# Patient Record
Sex: Female | Born: 1935
Health system: Southern US, Community
[De-identification: ages and names within clinical notes are randomized; demographics above are authoritative.]

## PROBLEM LIST (undated history)

## (undated) DIAGNOSIS — I10 Essential (primary) hypertension: Secondary | ICD-10-CM

## (undated) HISTORY — PX: TOTAL KNEE ARTHROPLASTY: SHX125

---

## 2013-12-30 ENCOUNTER — Emergency Department (HOSPITAL_BASED_OUTPATIENT_CLINIC_OR_DEPARTMENT_OTHER)
Admission: EM | Admit: 2013-12-30 | Discharge: 2013-12-30 | Disposition: A | Discharge status: Home / Self Care | Payer: Medicare Other | Attending: Emergency Medicine | Admitting: Emergency Medicine

## 2013-12-30 ENCOUNTER — Emergency Department (HOSPITAL_BASED_OUTPATIENT_CLINIC_OR_DEPARTMENT_OTHER): Payer: Medicare Other

## 2013-12-30 ENCOUNTER — Encounter (HOSPITAL_BASED_OUTPATIENT_CLINIC_OR_DEPARTMENT_OTHER): Payer: Self-pay | Admitting: Emergency Medicine

## 2013-12-30 DIAGNOSIS — Y9301 Activity, walking, marching and hiking: Secondary | ICD-10-CM | POA: Diagnosis not present

## 2013-12-30 DIAGNOSIS — S0101XA Laceration without foreign body of scalp, initial encounter: Secondary | ICD-10-CM | POA: Diagnosis not present

## 2013-12-30 DIAGNOSIS — S0990XA Unspecified injury of head, initial encounter: Secondary | ICD-10-CM | POA: Insufficient documentation

## 2013-12-30 DIAGNOSIS — I10 Essential (primary) hypertension: Secondary | ICD-10-CM | POA: Diagnosis not present

## 2013-12-30 DIAGNOSIS — W19XXXA Unspecified fall, initial encounter: Secondary | ICD-10-CM

## 2013-12-30 DIAGNOSIS — Y998 Other external cause status: Secondary | ICD-10-CM | POA: Diagnosis not present

## 2013-12-30 DIAGNOSIS — W01198A Fall on same level from slipping, tripping and stumbling with subsequent striking against other object, initial encounter: Secondary | ICD-10-CM | POA: Insufficient documentation

## 2013-12-30 DIAGNOSIS — Y9289 Other specified places as the place of occurrence of the external cause: Secondary | ICD-10-CM | POA: Insufficient documentation

## 2013-12-30 HISTORY — DX: Essential (primary) hypertension: I10

## 2013-12-30 MED ORDER — LIDOCAINE-EPINEPHRINE 2 %-1:100000 IJ SOLN
INTRAMUSCULAR | Status: AC
  Administered 2013-12-30: 1.7 mL via INTRADERMAL
  Filled 2013-12-30: qty 1

## 2013-12-30 MED ORDER — TETANUS-DIPHTH-ACELL PERTUSSIS 5-2.5-18.5 LF-MCG/0.5 IM SUSP: 0.5000 mL | Freq: Once | INTRAMUSCULAR | Status: DC

## 2013-12-30 MED ORDER — LIDOCAINE-EPINEPHRINE 2 %-1:100000 IJ SOLN
1.7000 mL | Freq: Once | INTRAMUSCULAR | Status: AC
  Administered 2013-12-30: 1.7 mL via INTRADERMAL

## 2013-12-30 NOTE — Discharge Instructions (Signed)
Staples to be removed in 5-7 days. Return sooner if you develop increased pain, redness, pus draining from the wound, or other new and concerning symptoms.   Concussion A concussion, or closed-head injury, is a brain injury caused by a direct blow to the head or by a quick and sudden movement (jolt) of the head or neck. Concussions are usually not life-threatening. Even so, the effects of a concussion can be serious. If you have had a concussion before, you are more likely to experience concussion-like symptoms after a direct blow to the head.  CAUSES  Direct blow to the head, such as from running into another player during a soccer game, being hit in a fight, or hitting your head on a hard surface.  A jolt of the head or neck that causes the brain to move back and forth inside the skull, such as in a car crash. SIGNS AND SYMPTOMS The signs of a concussion can be hard to notice. Early on, they may be missed by you, family members, and health care providers. You may look fine but act or feel differently. Symptoms are usually temporary, but they may last for days, weeks, or even longer. Some symptoms may appear right away while others may not show up for hours or days. Every head injury is different. Symptoms include:  Mild to moderate headaches that will not go away.  A feeling of pressure inside your head.  Having more trouble than usual:  Learning or remembering things you have heard.  Answering questions.  Paying attention or concentrating.  Organizing daily tasks.  Making decisions and solving problems.  Slowness in thinking, acting or reacting, speaking, or reading.  Getting lost or being easily confused.  Feeling tired all the time or lacking energy (fatigued).  Feeling drowsy.  Sleep disturbances.  Sleeping more than usual.  Sleeping less than usual.  Trouble falling asleep.  Trouble sleeping (insomnia).  Loss of balance or feeling lightheaded or dizzy.  Nausea  or vomiting.  Numbness or tingling.  Increased sensitivity to:  Sounds.  Lights.  Distractions.  Vision problems or eyes that tire easily.  Diminished sense of taste or smell.  Ringing in the ears.  Mood changes such as feeling sad or anxious.  Becoming easily irritated or angry for little or no reason.  Lack of motivation.  Seeing or hearing things other people do not see or hear (hallucinations). DIAGNOSIS Your health care provider can usually diagnose a concussion based on a description of your injury and symptoms. He or she will ask whether you passed out (lost consciousness) and whether you are having trouble remembering events that happened right before and during your injury. Your evaluation might include:  A brain scan to look for signs of injury to the brain. Even if the test shows no injury, you may still have a concussion.  Blood tests to be sure other problems are not present. TREATMENT  Concussions are usually treated in an emergency department, in urgent care, or at a clinic. You may need to stay in the hospital overnight for further treatment.  Tell your health care provider if you are taking any medicines, including prescription medicines, over-the-counter medicines, and natural remedies. Some medicines, such as blood thinners (anticoagulants) and aspirin, may increase the chance of complications. Also tell your health care provider whether you have had alcohol or are taking illegal drugs. This information may affect treatment.  Your health care provider will send you home with important instructions to follow.  How fast you will recover from a concussion depends on many factors. These factors include how severe your concussion is, what part of your brain was injured, your age, and how healthy you were before the concussion.  Most people with mild injuries recover fully. Recovery can take time. In general, recovery is slower in older persons. Also, persons  who have had a concussion in the past or have other medical problems may find that it takes longer to recover from their current injury. HOME CARE INSTRUCTIONS General Instructions  Carefully follow the directions your health care provider gave you.  Only take over-the-counter or prescription medicines for pain, discomfort, or fever as directed by your health care provider.  Take only those medicines that your health care provider has approved.  Do not drink alcohol until your health care provider says you are well enough to do so. Alcohol and certain other drugs may slow your recovery and can put you at risk of further injury.  If it is harder than usual to remember things, write them down.  If you are easily distracted, try to do one thing at a time. For example, do not try to watch TV while fixing dinner.  Talk with family members or close friends when making important decisions.  Keep all follow-up appointments. Repeated evaluation of your symptoms is recommended for your recovery.  Watch your symptoms and tell others to do the same. Complications sometimes occur after a concussion. Older adults with a brain injury may have a higher risk of serious complications, such as a blood clot on the brain.  Tell your teachers, school nurse, school counselor, coach, athletic trainer, or work Production designer, theatre/television/filmmanager about your injury, symptoms, and restrictions. Tell them about what you can or cannot do. They should watch for:  Increased problems with attention or concentration.  Increased difficulty remembering or learning new information.  Increased time needed to complete tasks or assignments.  Increased irritability or decreased ability to cope with stress.  Increased symptoms.  Rest. Rest helps the brain to heal. Make sure you:  Get plenty of sleep at night. Avoid staying up late at night.  Keep the same bedtime hours on weekends and weekdays.  Rest during the day. Take daytime naps or rest  breaks when you feel tired.  Limit activities that require a lot of thought or concentration. These include:  Doing homework or job-related work.  Watching TV.  Working on the computer.  Avoid any situation where there is potential for another head injury (football, hockey, soccer, basketball, martial arts, downhill snow sports and horseback riding). Your condition will get worse every time you experience a concussion. You should avoid these activities until you are evaluated by the appropriate follow-up health care providers. Returning To Your Regular Activities You will need to return to your normal activities slowly, not all at once. You must give your body and brain enough time for recovery.  Do not return to sports or other athletic activities until your health care provider tells you it is safe to do so.  Ask your health care provider when you can drive, ride a bicycle, or operate heavy machinery. Your ability to react may be slower after a brain injury. Never do these activities if you are dizzy.  Ask your health care provider about when you can return to work or school. Preventing Another Concussion It is very important to avoid another brain injury, especially before you have recovered. In rare cases, another injury can lead to permanent  brain damage, brain swelling, or death. The risk of this is greatest during the first 7-10 days after a head injury. Avoid injuries by:  Wearing a seat belt when riding in a car.  Drinking alcohol only in moderation.  Wearing a helmet when biking, skiing, skateboarding, skating, or doing similar activities.  Avoiding activities that could lead to a second concussion, such as contact or recreational sports, until your health care provider says it is okay.  Taking safety measures in your home.  Remove clutter and tripping hazards from floors and stairways.  Use grab bars in bathrooms and handrails by stairs.  Place non-slip mats on floors  and in bathtubs.  Improve lighting in dim areas. SEEK MEDICAL CARE IF:  You have increased problems paying attention or concentrating.  You have increased difficulty remembering or learning new information.  You need more time to complete tasks or assignments than before.  You have increased irritability or decreased ability to cope with stress.  You have more symptoms than before. Seek medical care if you have any of the following symptoms for more than 2 weeks after your injury:  Lasting (chronic) headaches.  Dizziness or balance problems.  Nausea.  Vision problems.  Increased sensitivity to noise or light.  Depression or mood swings.  Anxiety or irritability.  Memory problems.  Difficulty concentrating or paying attention.  Sleep problems.  Feeling tired all the time. SEEK IMMEDIATE MEDICAL CARE IF:  You have severe or worsening headaches. These may be a sign of a blood clot in the brain.  You have weakness (even if only in one hand, leg, or part of the face).  You have numbness.  You have decreased coordination.  You vomit repeatedly.  You have increased sleepiness.  One pupil is larger than the other.  You have convulsions.  You have slurred speech.  You have increased confusion. This may be a sign of a blood clot in the brain.  You have increased restlessness, agitation, or irritability.  You are unable to recognize people or places.  You have neck pain.  It is difficult to wake you up.  You have unusual behavior changes.  You lose consciousness. MAKE SURE YOU:  Understand these instructions.  Will watch your condition.  Will get help right away if you are not doing well or get worse. Document Released: 03/14/2003 Document Revised: 12/27/2012 Document Reviewed: 07/14/2012 [facility] Patient Information 2015 [place], [place]. This information is not intended to replace advice given to you by your health care provider. Make sure you  discuss any questions you have with your health care provider.

## 2013-12-30 NOTE — ED Notes (Signed)
Pt presents to ED with complaints of head lac after falling today. No bleeding in triage

## 2013-12-30 NOTE — ED Provider Notes (Signed)
CSN: [id number]     Arrival date & time 12/30/13  1831 History  This chart was scribed for Geoffery Lyonsouglas Gabriel Conry, MD by Modena JanskyAlbert Thayil, ED Scribe. This patient was seen in room MH09/MH09 and the patient's care was started at 7:54 PM.    Chief Complaint  Patient presents with  . Fall  . Head Laceration   Patient is a 80 y.o. female presenting with fall and scalp laceration. The history is provided by the patient. No language interpreter was used.  Fall This is a new problem. The current episode started 3 to 5 hours ago. The problem occurs rarely. The problem has not changed since onset.Nothing aggravates the symptoms. She has tried nothing for the symptoms.  Head Laceration   HPI Comments: Sara Estrada is a 80 y.o. female who presents to the Emergency Department complaining of a fall that occurred about 3 hours ago. She reports that fell while ambulating and hit her head on the wall. She reports no LOC. She states that she has a head laceration that has been bleeding quite a bit. She reports that she is unsure of her tetanus status. She denies any use of blood thinners (aside from aspirin), numbness or tingling, or neck pain. NKA  Past Medical History  Diagnosis Date  . Hypertension    Past Surgical History  Procedure Laterality Date  . Total knee arthroplasty     No family history on file. History  Substance Use Topics  . Smoking status: Never Smoker   . Smokeless tobacco: Not on file  . Alcohol Use: Not on file   OB History    No data available     Review of Systems  Musculoskeletal: Negative for neck pain.  Skin: Positive for wound.  Neurological: Negative for numbness.  Hematological: Does not bruise/bleed easily.  All other systems reviewed and are negative.     Allergies  Review of patient's allergies indicates no known allergies.  Home Medications   Prior to Admission medications   Not on File   BP 141/74 mmHg  Pulse 80  Temp(Src) 98.3 F (36.8 C) (Oral)  Resp 18   Ht 5' (1.524 m)  Wt 134 lb (60.782 kg)  BMI 26.17 kg/m2  SpO2 95% Physical Exam  Constitutional: She is oriented to person, place, and time. She appears well-developed and well-nourished. No distress.  HENT:  Head: Normocephalic and atraumatic.  2.5 cm superficial laceration to the right parietal region. Bleeding is controlled and laceration is well approximated. NO palpable defects.   Eyes: EOM are normal. Pupils are equal, round, and reactive to light.  Neck: Neck supple. No tracheal deviation present.  NO C spine TTP. Painless RON in all directions.   Cardiovascular: Normal rate.   Pulmonary/Chest: Effort normal. No respiratory distress.  Musculoskeletal: Normal range of motion.  Neurological: She is alert and oriented to person, place, and time. No cranial nerve deficit. She exhibits normal muscle tone. Coordination normal.  Skin: Skin is warm and dry.  Psychiatric: She has a normal mood and affect. Her behavior is normal.  Nursing note and vitals reviewed.   ED Course  Procedures (including critical care time) DIAGNOSTIC STUDIES: Oxygen Saturation is 95% on RA, normal by my interpretation.    COORDINATION OF CARE: 7:58 PM- Pt advised of plan for treatment and pt agrees.  Labs Review Labs Reviewed - No data to display  Imaging Review No results found.   EKG Interpretation None     LACERATION REPAIR Performed by: Judd LieneLo,  Riley Lamouglas Authorized by: Geoffery LyonseLo, Jafari Mckillop Consent: Verbal consent obtained. Risks and benefits: risks, benefits and alternatives were discussed Consent given by: patient Patient identity confirmed: provided demographic data Prepped and Draped in normal sterile fashion Wound explored  Laceration Location: Right parietal scalp  Laceration Length: 2.5 cm  No Foreign Bodies seen or palpated  Anesthesia: local infiltration  Local anesthetic: lidocaine 1 % with epinephrine  Anesthetic total: 2 ml  Irrigation method: syringe Amount of cleaning:  standard  Skin closure: Staples   Number of sutures: 3 staples   Technique: Staples   Patient tolerance: Patient tolerated the procedure well with no immediate complications.   MDM   Final diagnoses:  None    Patient presents after a fall while walking down the hall. She struck the right top of her head on the wall and caused a small laceration which was repaired. He was no loss of consciousness, she is neurologically intact, and CT scan of the head reveals no acute intracranial abnormality. At this point I feel as though she is appropriate for discharge with suture removal in 5 days.  I personally performed the services described in this documentation, which was scribed in my presence. The recorded information has been reviewed and is accurate.      Geoffery Lyonsouglas Johniece Hornbaker, MD 12/31/13 2014

## 2013-12-30 NOTE — ED Notes (Signed)
Pt d/c'd per order prior to new order of Tdap.

## 2013-12-30 NOTE — ED Notes (Signed)
[facility]. ~1", scant ooze present.

## 2013-12-30 NOTE — ED Notes (Signed)
Pt alert, NAD, calm, interactive, speech clear, no dyspnea noted, (denies: pain, sob, nausea, visual changes or dizziness), [facility]

## 2013-12-30 NOTE — ED Notes (Signed)
Wound closed with 3 staples, approximated well. No oozing or bleeding noted. Continues to deny pain. Bacitracin applied.

## 2014-01-04 ENCOUNTER — Encounter (HOSPITAL_BASED_OUTPATIENT_CLINIC_OR_DEPARTMENT_OTHER): Payer: Self-pay | Admitting: *Deleted

## 2014-01-04 ENCOUNTER — Emergency Department (HOSPITAL_BASED_OUTPATIENT_CLINIC_OR_DEPARTMENT_OTHER)
Admission: EM | Admit: 2014-01-04 | Discharge: 2014-01-04 | Disposition: A | Discharge status: Home / Self Care | Payer: Medicare Other | Attending: Emergency Medicine | Admitting: Emergency Medicine

## 2014-01-04 DIAGNOSIS — Z4802 Encounter for removal of sutures: Secondary | ICD-10-CM | POA: Insufficient documentation

## 2014-01-04 DIAGNOSIS — I10 Essential (primary) hypertension: Secondary | ICD-10-CM | POA: Diagnosis not present

## 2014-01-04 NOTE — ED Notes (Addendum)
Here for staple removal. Staples x 3 noted to her scalp.

## 2014-01-04 NOTE — Discharge Instructions (Signed)

## 2014-01-04 NOTE — ED Provider Notes (Signed)
CSN: [id number]     Arrival date & time 01/04/14  1149 History   First MD Initiated Contact with Patient 01/04/14 1207     Chief Complaint  Patient presents with  . Suture / Staple Removal     (Consider location/radiation/quality/duration/timing/severity/associated sxs/prior Treatment) Patient is a 80 y.o. female presenting with suture removal. The history is provided by the patient. No language interpreter was used.  Suture / Staple Removal This is a new problem. The current episode started today. The problem occurs constantly. The problem has been unchanged. Pertinent negatives include no headaches. Nothing aggravates the symptoms. She has tried nothing for the symptoms. The treatment provided no relief.  Pt here for staple removal  Past Medical History  Diagnosis Date  . Hypertension    Past Surgical History  Procedure Laterality Date  . Total knee arthroplasty     No family history on file. History  Substance Use Topics  . Smoking status: Never Smoker   . Smokeless tobacco: Not on file  . Alcohol Use: Not on file   OB History    No data available     Review of Systems  Neurological: Negative for headaches.  All other systems reviewed and are negative.     Allergies  Review of patient's allergies indicates no known allergies.  Home Medications   Prior to Admission medications   Not on File   BP 133/76 mmHg  Pulse 67  Temp(Src) 98.3 F (36.8 C) (Oral)  Resp 16  Ht 5' (1.524 m)  Wt 134 lb (60.782 kg)  BMI 26.17 kg/m2  SpO2 93% Physical Exam  Constitutional: She is oriented to person, place, and time. She appears well-developed and well-nourished.  HENT:  Head: Normocephalic and atraumatic.  Eyes: Conjunctivae and EOM are normal. Pupils are equal, round, and reactive to light.  Neck: Normal range of motion. Neck supple.  Cardiovascular: Normal rate and normal heart sounds.   Pulmonary/Chest: Effort normal and breath sounds normal.  Musculoskeletal:  Normal range of motion.  Neurological: She is alert and oriented to person, place, and time. She has normal reflexes.  Skin: Skin is warm.  Psychiatric: She has a normal mood and affect.  Nursing note and vitals reviewed.   ED Course  Procedures (including critical care time) Labs Review Labs Reviewed - No data to display  Imaging Review No results found.   EKG Interpretation None      MDM   Final diagnoses:  Removal of staples    Staples removed.    Lonia SkinnerLeslie K KingsburySofia, PA-C 01/04/14 1256  Ethelda ChickMartha K Linker, MD 01/04/14 [phone]

## 2015-02-14 DIAGNOSIS — J069 Acute upper respiratory infection, unspecified: Secondary | ICD-10-CM | POA: Diagnosis not present

## 2015-03-19 DIAGNOSIS — R7301 Impaired fasting glucose: Secondary | ICD-10-CM | POA: Diagnosis not present

## 2015-03-19 DIAGNOSIS — Z0000 Encounter for general adult medical examination without abnormal findings: Secondary | ICD-10-CM | POA: Diagnosis not present

## 2015-03-19 DIAGNOSIS — I10 Essential (primary) hypertension: Secondary | ICD-10-CM | POA: Diagnosis not present

## 2015-03-19 DIAGNOSIS — R630 Anorexia: Secondary | ICD-10-CM | POA: Diagnosis not present

## 2015-03-19 DIAGNOSIS — E782 Mixed hyperlipidemia: Secondary | ICD-10-CM | POA: Diagnosis not present

## 2015-03-19 DIAGNOSIS — Z1231 Encounter for screening mammogram for malignant neoplasm of breast: Secondary | ICD-10-CM | POA: Diagnosis not present

## 2015-03-19 DIAGNOSIS — K219 Gastro-esophageal reflux disease without esophagitis: Secondary | ICD-10-CM | POA: Diagnosis not present

## 2015-03-19 DIAGNOSIS — E559 Vitamin D deficiency, unspecified: Secondary | ICD-10-CM | POA: Diagnosis not present

## 2015-03-19 DIAGNOSIS — R600 Localized edema: Secondary | ICD-10-CM | POA: Diagnosis not present

## 2015-03-19 DIAGNOSIS — Z13820 Encounter for screening for osteoporosis: Secondary | ICD-10-CM | POA: Diagnosis not present

## 2015-03-19 DIAGNOSIS — F320 Major depressive disorder, single episode, mild: Secondary | ICD-10-CM | POA: Diagnosis not present

## 2015-03-19 DIAGNOSIS — Z79899 Other long term (current) drug therapy: Secondary | ICD-10-CM | POA: Diagnosis not present

## 2015-03-20 DIAGNOSIS — H3530 Unspecified macular degeneration: Secondary | ICD-10-CM | POA: Diagnosis not present

## 2015-03-20 DIAGNOSIS — H1859 Other hereditary corneal dystrophies: Secondary | ICD-10-CM | POA: Diagnosis not present

## 2015-03-20 DIAGNOSIS — H04123 Dry eye syndrome of bilateral lacrimal glands: Secondary | ICD-10-CM | POA: Diagnosis not present

## 2015-04-16 DIAGNOSIS — E782 Mixed hyperlipidemia: Secondary | ICD-10-CM | POA: Diagnosis not present

## 2015-04-16 DIAGNOSIS — I10 Essential (primary) hypertension: Secondary | ICD-10-CM | POA: Diagnosis not present

## 2015-04-16 DIAGNOSIS — F320 Major depressive disorder, single episode, mild: Secondary | ICD-10-CM | POA: Diagnosis not present

## 2015-04-23 DIAGNOSIS — Z1231 Encounter for screening mammogram for malignant neoplasm of breast: Secondary | ICD-10-CM | POA: Diagnosis not present

## 2015-05-13 DIAGNOSIS — M1712 Unilateral primary osteoarthritis, left knee: Secondary | ICD-10-CM | POA: Diagnosis not present

## 2015-05-13 DIAGNOSIS — M778 Other enthesopathies, not elsewhere classified: Secondary | ICD-10-CM | POA: Diagnosis not present

## 2015-05-13 DIAGNOSIS — M25532 Pain in left wrist: Secondary | ICD-10-CM | POA: Diagnosis not present

## 2015-09-05 DIAGNOSIS — M1712 Unilateral primary osteoarthritis, left knee: Secondary | ICD-10-CM | POA: Diagnosis not present

## 2015-09-07 DIAGNOSIS — I10 Essential (primary) hypertension: Secondary | ICD-10-CM | POA: Diagnosis not present

## 2015-09-07 DIAGNOSIS — E782 Mixed hyperlipidemia: Secondary | ICD-10-CM | POA: Diagnosis not present

## 2015-09-17 DIAGNOSIS — R7301 Impaired fasting glucose: Secondary | ICD-10-CM | POA: Diagnosis not present

## 2015-09-17 DIAGNOSIS — R413 Other amnesia: Secondary | ICD-10-CM | POA: Diagnosis not present

## 2015-09-17 DIAGNOSIS — E782 Mixed hyperlipidemia: Secondary | ICD-10-CM | POA: Diagnosis not present

## 2015-09-17 DIAGNOSIS — I10 Essential (primary) hypertension: Secondary | ICD-10-CM | POA: Diagnosis not present

## 2015-09-21 DIAGNOSIS — R7301 Impaired fasting glucose: Secondary | ICD-10-CM | POA: Diagnosis not present

## 2016-02-14 DIAGNOSIS — J209 Acute bronchitis, unspecified: Secondary | ICD-10-CM | POA: Diagnosis not present

## 2016-03-02 DIAGNOSIS — M25531 Pain in right wrist: Secondary | ICD-10-CM | POA: Diagnosis not present

## 2016-03-02 DIAGNOSIS — R0781 Pleurodynia: Secondary | ICD-10-CM | POA: Diagnosis not present

## 2016-03-02 DIAGNOSIS — S6991XA Unspecified injury of right wrist, hand and finger(s), initial encounter: Secondary | ICD-10-CM | POA: Diagnosis not present

## 2016-03-02 DIAGNOSIS — S299XXA Unspecified injury of thorax, initial encounter: Secondary | ICD-10-CM | POA: Diagnosis not present

## 2016-03-15 IMAGING — CT CT HEAD W/O CM
1 series · 16 of 30 positions shown, 20 images · non-contrast
Comparison: None.

CLINICAL DATA: Recent fall with head injury on the right, initial
encounter

EXAM:
CT HEAD WITHOUT CONTRAST
TECHNIQUE: Contiguous axial images were obtained from the base of the skull
through the vertex without intravenous contrast.

[Series 2: head 4.8 h37s · axial · 0.44mm/px · z∈[-133,-0]mm · 16 of 32 slices shown, 20 images]
[im 2/32  brain]
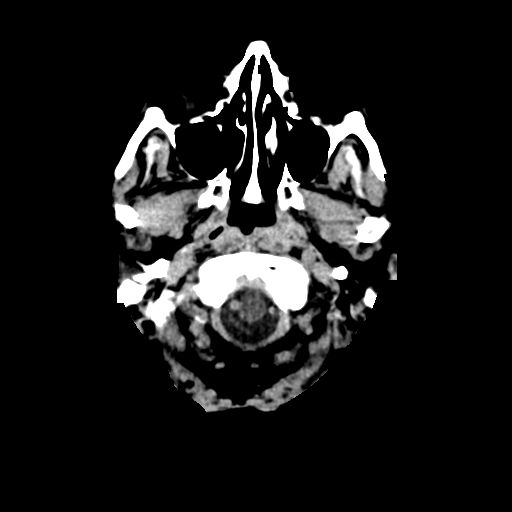
[im 2/32  bone]
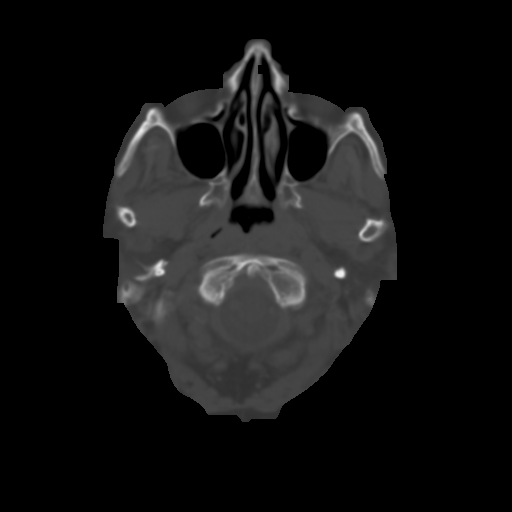
[im 4/32  brain]
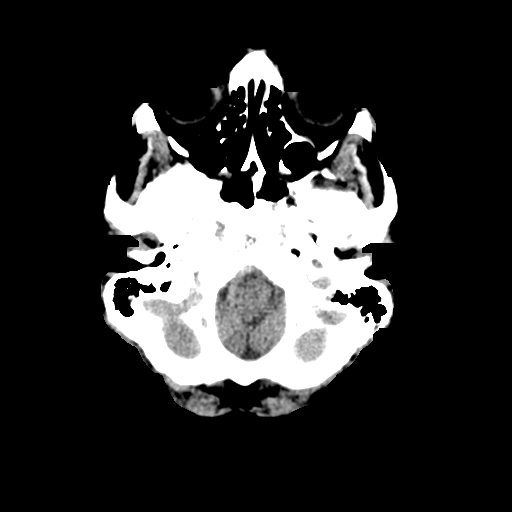
[im 6/32  brain]
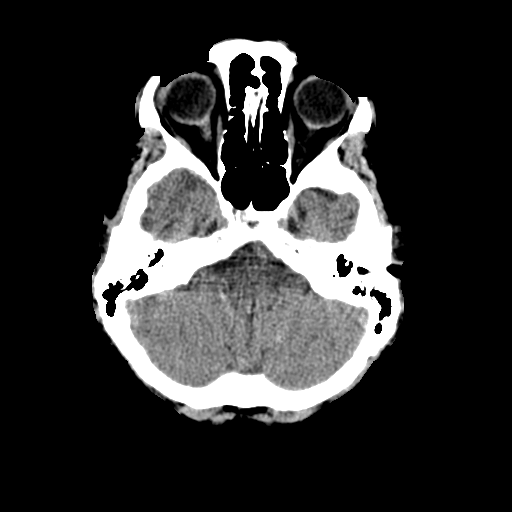
[im 8/32  brain]
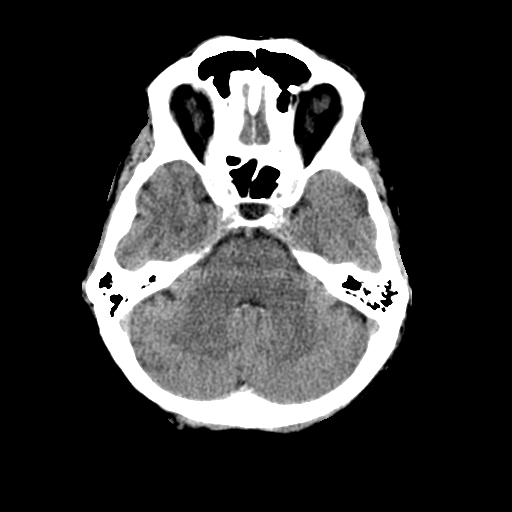
[im 9/32  brain]
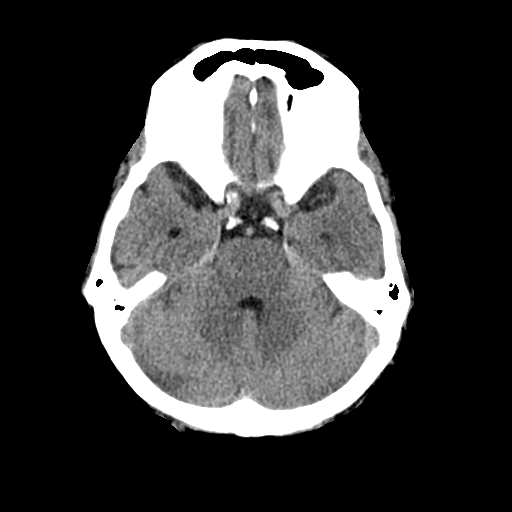
[im 9/32  bone]
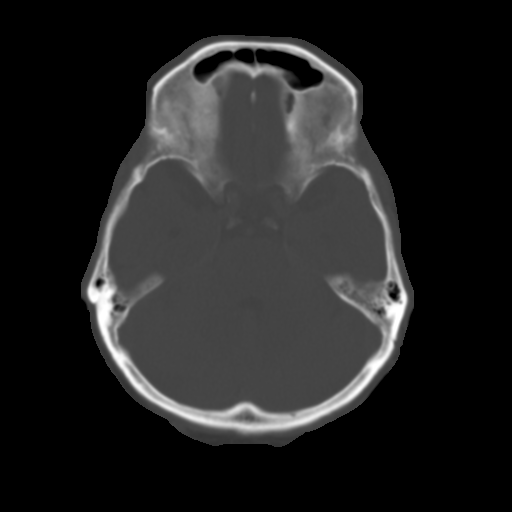
[im 11/32  brain]
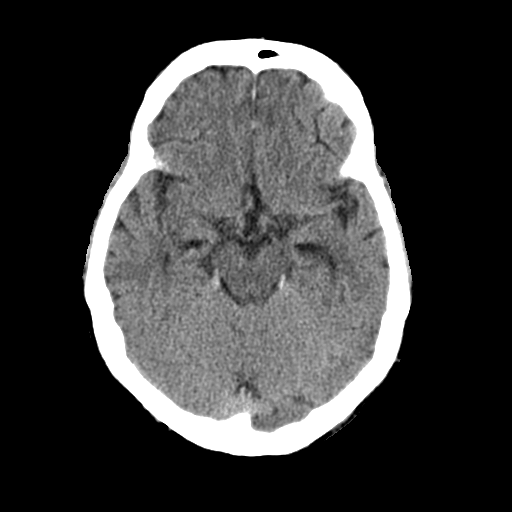
[im 13/32  brain]
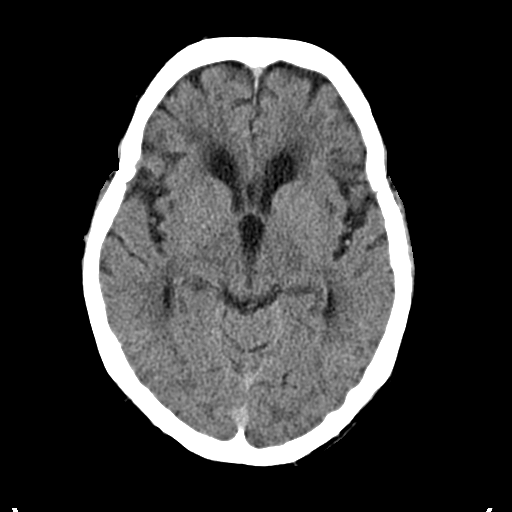
[im 15/32  brain]
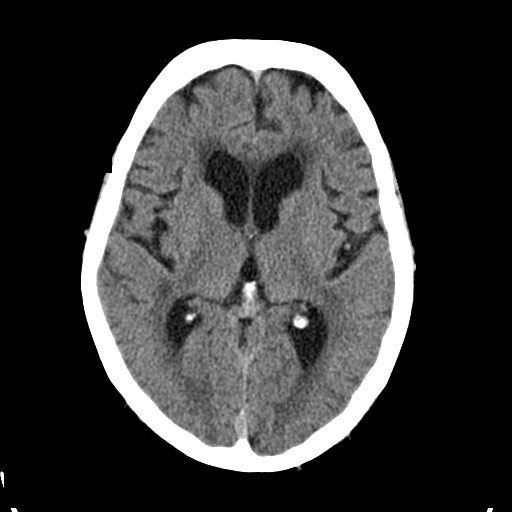
[im 17/32  brain]
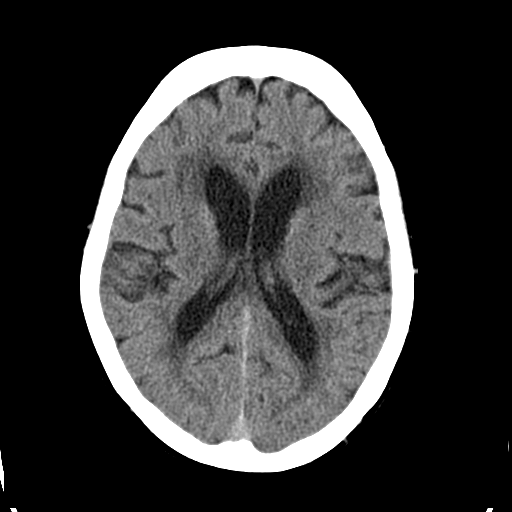
[im 17/32  bone]
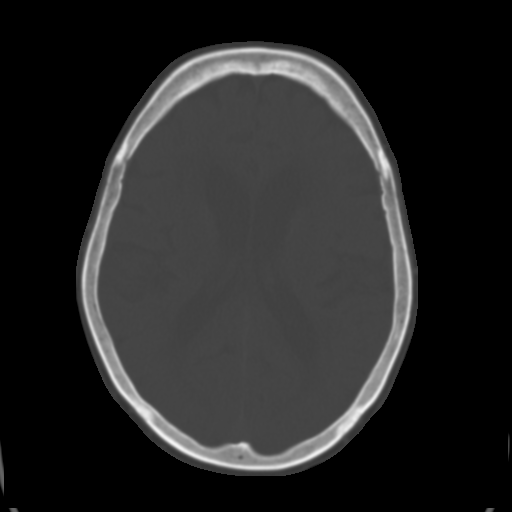
[im 19/32  brain]
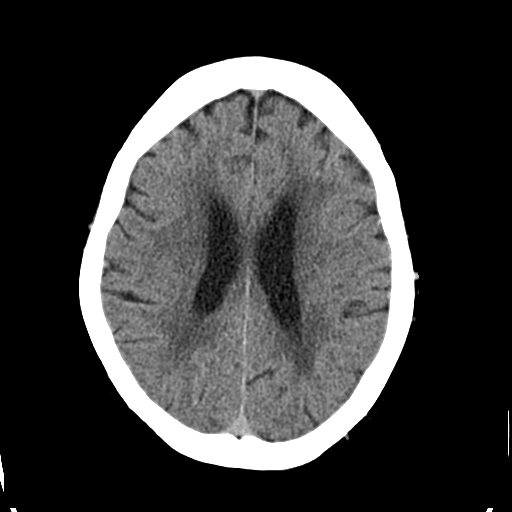
[im 21/32  brain]
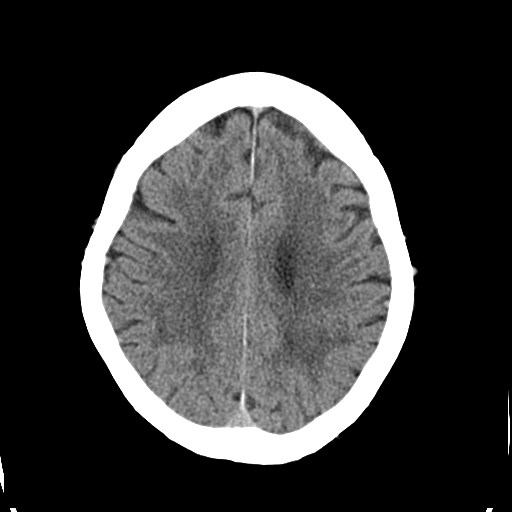
[im 23/32  brain]
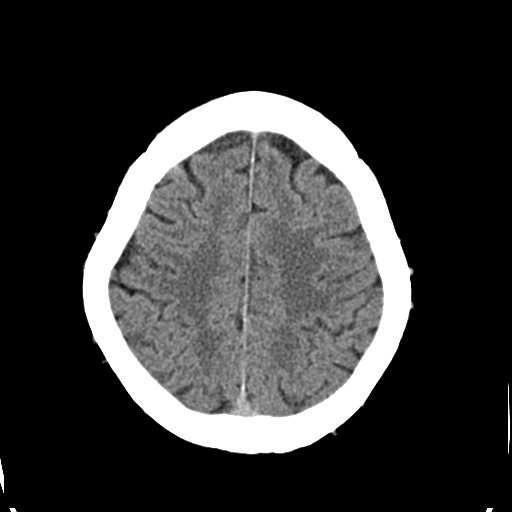
[im 24/32  brain]
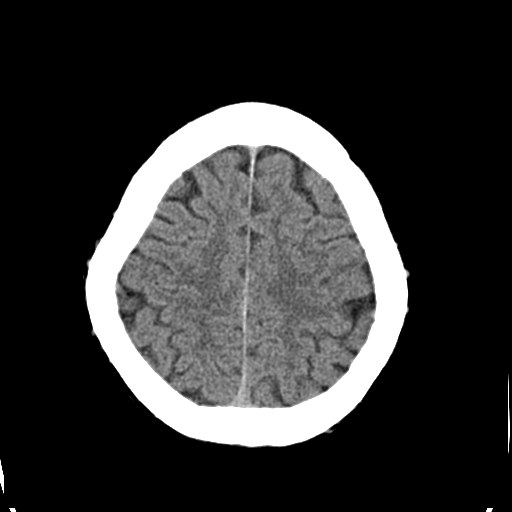
[im 24/32  bone]
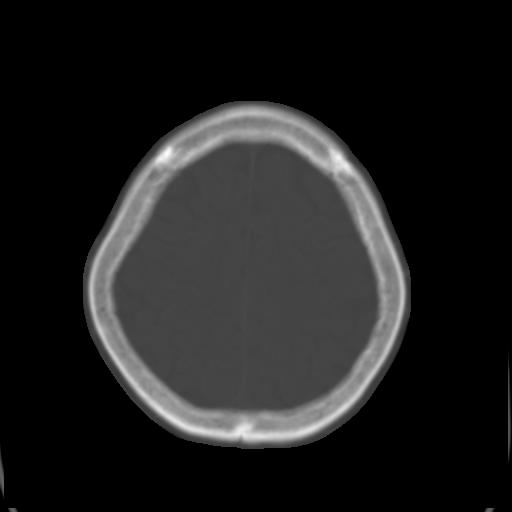
[im 26/32  brain]
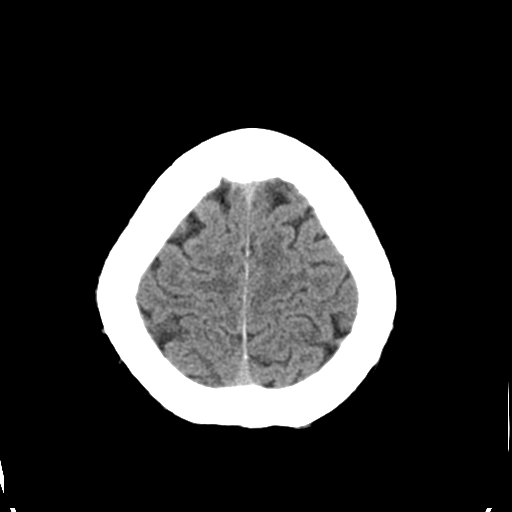
[im 28/32  brain]
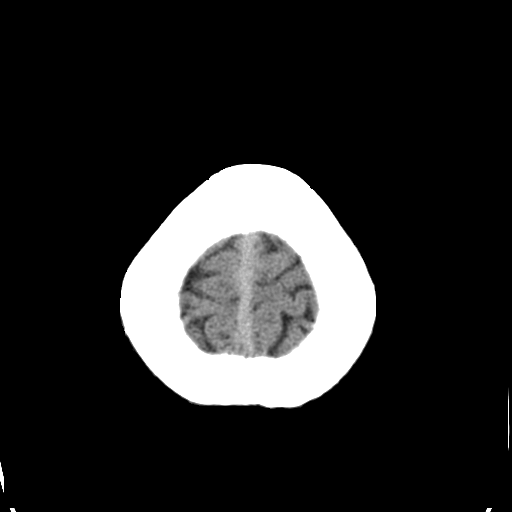
[im 30/32  brain]
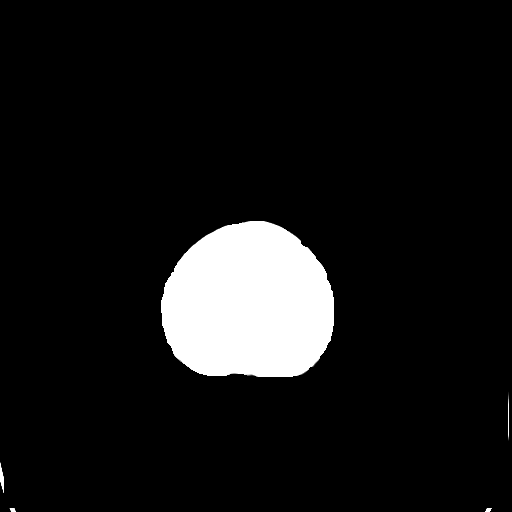

[16 of 30 positions shown; findings below may reference images not displayed]

FINDINGS: Bony calvarium is intact. No gross soft tissue abnormality is noted.
Mild atrophic and chronic white matter ischemic changes are noted.
No findings to suggest acute hemorrhage, acute infarction or
space-occupying mass lesion are noted.
IMPRESSION: Chronic atrophic and ischemic changes without acute abnormality.

## 2016-03-19 DIAGNOSIS — Z79899 Other long term (current) drug therapy: Secondary | ICD-10-CM | POA: Diagnosis not present

## 2016-03-19 DIAGNOSIS — Z0000 Encounter for general adult medical examination without abnormal findings: Secondary | ICD-10-CM | POA: Diagnosis not present

## 2016-03-19 DIAGNOSIS — F320 Major depressive disorder, single episode, mild: Secondary | ICD-10-CM | POA: Diagnosis not present

## 2016-03-19 DIAGNOSIS — Z1231 Encounter for screening mammogram for malignant neoplasm of breast: Secondary | ICD-10-CM | POA: Diagnosis not present

## 2016-03-19 DIAGNOSIS — R600 Localized edema: Secondary | ICD-10-CM | POA: Diagnosis not present

## 2016-03-19 DIAGNOSIS — Z780 Asymptomatic menopausal state: Secondary | ICD-10-CM | POA: Diagnosis not present

## 2016-03-19 DIAGNOSIS — R7309 Other abnormal glucose: Secondary | ICD-10-CM | POA: Diagnosis not present

## 2016-03-19 DIAGNOSIS — R413 Other amnesia: Secondary | ICD-10-CM | POA: Diagnosis not present

## 2016-03-19 DIAGNOSIS — I10 Essential (primary) hypertension: Secondary | ICD-10-CM | POA: Diagnosis not present

## 2016-03-19 DIAGNOSIS — N289 Disorder of kidney and ureter, unspecified: Secondary | ICD-10-CM | POA: Diagnosis not present

## 2016-03-19 DIAGNOSIS — E782 Mixed hyperlipidemia: Secondary | ICD-10-CM | POA: Diagnosis not present

## 2016-03-28 DIAGNOSIS — E876 Hypokalemia: Secondary | ICD-10-CM | POA: Diagnosis not present

## 2016-05-27 DIAGNOSIS — M1712 Unilateral primary osteoarthritis, left knee: Secondary | ICD-10-CM | POA: Diagnosis not present

## 2016-06-18 DIAGNOSIS — Z1231 Encounter for screening mammogram for malignant neoplasm of breast: Secondary | ICD-10-CM | POA: Diagnosis not present

## 2016-09-12 DIAGNOSIS — E782 Mixed hyperlipidemia: Secondary | ICD-10-CM | POA: Diagnosis not present

## 2016-09-25 DIAGNOSIS — E782 Mixed hyperlipidemia: Secondary | ICD-10-CM | POA: Diagnosis not present

## 2016-09-25 DIAGNOSIS — I10 Essential (primary) hypertension: Secondary | ICD-10-CM | POA: Diagnosis not present

## 2016-09-25 DIAGNOSIS — R7301 Impaired fasting glucose: Secondary | ICD-10-CM | POA: Diagnosis not present

## 2016-09-25 DIAGNOSIS — Z23 Encounter for immunization: Secondary | ICD-10-CM | POA: Diagnosis not present

## 2016-09-25 DIAGNOSIS — E876 Hypokalemia: Secondary | ICD-10-CM | POA: Diagnosis not present

## 2017-02-06 DIAGNOSIS — J189 Pneumonia, unspecified organism: Secondary | ICD-10-CM | POA: Diagnosis not present

## 2017-02-11 ENCOUNTER — Emergency Department (HOSPITAL_BASED_OUTPATIENT_CLINIC_OR_DEPARTMENT_OTHER)
Admission: EM | Admit: 2017-02-11 | Discharge: 2017-02-11 | Disposition: A | Discharge status: Home / Self Care | Payer: PPO | Attending: Emergency Medicine | Admitting: Emergency Medicine

## 2017-02-11 ENCOUNTER — Encounter (HOSPITAL_BASED_OUTPATIENT_CLINIC_OR_DEPARTMENT_OTHER): Payer: Self-pay | Admitting: Emergency Medicine

## 2017-02-11 ENCOUNTER — Other Ambulatory Visit: Payer: Self-pay

## 2017-02-11 ENCOUNTER — Emergency Department (HOSPITAL_BASED_OUTPATIENT_CLINIC_OR_DEPARTMENT_OTHER): Payer: PPO

## 2017-02-11 DIAGNOSIS — J069 Acute upper respiratory infection, unspecified: Secondary | ICD-10-CM | POA: Diagnosis not present

## 2017-02-11 DIAGNOSIS — Z96659 Presence of unspecified artificial knee joint: Secondary | ICD-10-CM | POA: Insufficient documentation

## 2017-02-11 DIAGNOSIS — I10 Essential (primary) hypertension: Secondary | ICD-10-CM | POA: Insufficient documentation

## 2017-02-11 DIAGNOSIS — R05 Cough: Secondary | ICD-10-CM | POA: Diagnosis not present

## 2017-02-11 NOTE — ED Provider Notes (Signed)
[facility] EMERGENCY DEPARTMENT Provider Note   CSN: [id number] Arrival date & time: 02/11/17  1548     History   Chief Complaint Chief Complaint  Patient presents with  . Cough    HPI Sara Estrada is a 84 y.o. female.  HPI Patient presents with cough for the last 3 weeks.  Has been to her primary care doctor.  Not had an x-ray.  Has been on albuterol inhaler and doxycycline.  Has not been getting any better.  No fevers.  Mild shortness of breath.  No underlying lung problems.  She does not smoke.  No sputum production.  No chest pain.  No swelling in her legs. Past Medical History:  Diagnosis Date  . Hypertension     There are no active problems to display for this patient.   Past Surgical History:  Procedure Laterality Date  . TOTAL KNEE ARTHROPLASTY      OB History    No data available       Home Medications    Prior to Admission medications   Not on File    Family History History reviewed. No pertinent family history.  Social History Social History   Tobacco Use  . Smoking status: Never Smoker  . Smokeless tobacco: Never Used  Substance Use Topics  . Alcohol use: No    Frequency: Never  . Drug use: No     Allergies   Patient has no known allergies.   Review of Systems Review of Systems  Constitutional: Negative for chills and fever.  HENT: Negative for congestion.   Respiratory: Positive for cough.   Cardiovascular: Negative for chest pain.  Gastrointestinal: Negative for abdominal pain.  Genitourinary: Negative for flank pain.  Musculoskeletal: Negative for back pain.  Skin: Negative for rash.  Neurological: Positive for weakness.  Hematological: Negative for adenopathy.  Psychiatric/Behavioral: Negative for confusion.     Physical Exam Updated Vital Signs BP (!) 142/65 (BP Location: Left Arm)   Pulse 63   Temp 98.6 F (37 C) (Oral)   Resp (!) 22   Ht 4\' 10"  (1.473 m)   Wt 59 kg (130 lb)   LMP 02/11/2017   SpO2  92%   BMI 27.17 kg/m   Physical Exam  Constitutional: She appears well-developed.  HENT:  Head: Atraumatic.  Eyes: EOM are normal.  Neck: Neck supple.  Cardiovascular: Normal rate.  Pulmonary/Chest:  Mildly harsh breath sounds without focal rales rhonchi or wheezes.  Occasional cough.  No respiratory distress.  Abdominal: Soft. There is no tenderness.  Musculoskeletal: She exhibits no edema.  Neurological: She is alert.  Skin: Skin is warm. Capillary refill takes less than 2 seconds.  Psychiatric: She has a normal mood and affect.     ED Treatments / Results  Labs (all labs ordered are listed, but only abnormal results are displayed) Labs Reviewed - No data to display  EKG  EKG Interpretation None       Radiology Dg Chest 2 View  Result Date: 02/11/2017 CLINICAL DATA:  Cough for 3 weeks EXAM: CHEST  2 VIEW COMPARISON:  March 02, 2016 FINDINGS: The heart size and mediastinal contours are stable. There is no focal infiltrate, pulmonary edema, or pleural effusion. The visualized skeletal structures are stable. IMPRESSION: No active cardiopulmonary disease. Electronically Signed   By: Sherian ReinWei-Chen  Lin M.D.   On: 02/11/2017 16:30    Procedures Procedures (including critical care time)  Medications Ordered in ED Medications - No data to display  Initial Impression / Assessment and Plan / ED Course  I have reviewed the triage vital signs and the nursing notes.  Pertinent labs & imaging results that were available during my care of the patient were reviewed by me and considered in my medical decision making (see chart for details).     Patient with cough.  Likely URI.  Already on doxycycline.  X-ray reassuring.  May be viral but will continue the antibiotics that she is Artie taking.  Will adjust cough medicines.  Discharge home.  Final Clinical Impressions(s) / ED Diagnoses   Final diagnoses:  Upper respiratory tract infection, unspecified type    ED Discharge  Orders    None       Benjiman Core, MD 02/11/17 1745

## 2017-02-11 NOTE — ED Notes (Signed)
[facility]  Family at bedside and expressed understanding.

## 2017-02-11 NOTE — ED Triage Notes (Signed)
Patient has had a cough x 3 weeks. Reports that she went to the MD on Saturday - however she is not getting any better

## 2017-02-11 NOTE — Discharge Instructions (Signed)
Take Robitussin-DM as needed to help with the cough.  Use the inhaler with the spacer now as needed also.

## 2017-02-15 DIAGNOSIS — R05 Cough: Secondary | ICD-10-CM | POA: Diagnosis not present

## 2017-03-22 DIAGNOSIS — E782 Mixed hyperlipidemia: Secondary | ICD-10-CM | POA: Diagnosis not present

## 2017-03-22 DIAGNOSIS — K219 Gastro-esophageal reflux disease without esophagitis: Secondary | ICD-10-CM | POA: Diagnosis not present

## 2017-03-22 DIAGNOSIS — Z0000 Encounter for general adult medical examination without abnormal findings: Secondary | ICD-10-CM | POA: Diagnosis not present

## 2017-03-22 DIAGNOSIS — R7301 Impaired fasting glucose: Secondary | ICD-10-CM | POA: Diagnosis not present

## 2017-03-22 DIAGNOSIS — I10 Essential (primary) hypertension: Secondary | ICD-10-CM | POA: Diagnosis not present

## 2017-05-25 DIAGNOSIS — H1859 Other hereditary corneal dystrophies: Secondary | ICD-10-CM | POA: Diagnosis not present

## 2017-05-25 DIAGNOSIS — H353131 Nonexudative age-related macular degeneration, bilateral, early dry stage: Secondary | ICD-10-CM | POA: Diagnosis not present

## 2017-05-25 DIAGNOSIS — H52223 Regular astigmatism, bilateral: Secondary | ICD-10-CM | POA: Diagnosis not present

## 2017-05-25 DIAGNOSIS — H04123 Dry eye syndrome of bilateral lacrimal glands: Secondary | ICD-10-CM | POA: Diagnosis not present

## 2017-09-23 DIAGNOSIS — R7301 Impaired fasting glucose: Secondary | ICD-10-CM | POA: Diagnosis not present

## 2017-09-23 DIAGNOSIS — I10 Essential (primary) hypertension: Secondary | ICD-10-CM | POA: Diagnosis not present

## 2017-09-23 DIAGNOSIS — E876 Hypokalemia: Secondary | ICD-10-CM | POA: Diagnosis not present

## 2017-09-23 DIAGNOSIS — L210 Seborrhea capitis: Secondary | ICD-10-CM | POA: Diagnosis not present

## 2017-09-23 DIAGNOSIS — F320 Major depressive disorder, single episode, mild: Secondary | ICD-10-CM | POA: Diagnosis not present

## 2017-09-23 DIAGNOSIS — E782 Mixed hyperlipidemia: Secondary | ICD-10-CM | POA: Diagnosis not present

## 2018-07-15 DIAGNOSIS — R9431 Abnormal electrocardiogram [ECG] [EKG]: Secondary | ICD-10-CM | POA: Diagnosis not present

## 2018-07-15 DIAGNOSIS — J811 Chronic pulmonary edema: Secondary | ICD-10-CM | POA: Diagnosis not present

## 2018-07-15 DIAGNOSIS — I2510 Atherosclerotic heart disease of native coronary artery without angina pectoris: Secondary | ICD-10-CM | POA: Diagnosis not present

## 2018-07-15 DIAGNOSIS — R739 Hyperglycemia, unspecified: Secondary | ICD-10-CM | POA: Diagnosis not present

## 2018-07-15 DIAGNOSIS — I10 Essential (primary) hypertension: Secondary | ICD-10-CM | POA: Diagnosis not present

## 2018-07-15 DIAGNOSIS — R509 Fever, unspecified: Secondary | ICD-10-CM | POA: Diagnosis not present

## 2018-07-15 DIAGNOSIS — Z8674 Personal history of sudden cardiac arrest: Secondary | ICD-10-CM | POA: Diagnosis not present

## 2018-07-15 DIAGNOSIS — R404 Transient alteration of awareness: Secondary | ICD-10-CM | POA: Diagnosis not present

## 2018-07-15 DIAGNOSIS — R4182 Altered mental status, unspecified: Secondary | ICD-10-CM | POA: Diagnosis not present

## 2018-07-15 DIAGNOSIS — G9340 Encephalopathy, unspecified: Secondary | ICD-10-CM | POA: Diagnosis not present

## 2018-07-15 DIAGNOSIS — I700 Atherosclerosis of aorta: Secondary | ICD-10-CM | POA: Diagnosis not present

## 2018-07-15 DIAGNOSIS — N3000 Acute cystitis without hematuria: Secondary | ICD-10-CM | POA: Diagnosis not present

## 2018-07-15 DIAGNOSIS — I272 Pulmonary hypertension, unspecified: Secondary | ICD-10-CM | POA: Diagnosis not present

## 2018-07-15 DIAGNOSIS — Z66 Do not resuscitate: Secondary | ICD-10-CM | POA: Diagnosis not present

## 2018-07-15 DIAGNOSIS — R0603 Acute respiratory distress: Secondary | ICD-10-CM | POA: Diagnosis not present

## 2018-07-15 DIAGNOSIS — K219 Gastro-esophageal reflux disease without esophagitis: Secondary | ICD-10-CM | POA: Diagnosis not present

## 2018-07-15 DIAGNOSIS — I499 Cardiac arrhythmia, unspecified: Secondary | ICD-10-CM | POA: Diagnosis not present

## 2018-07-15 DIAGNOSIS — E785 Hyperlipidemia, unspecified: Secondary | ICD-10-CM | POA: Diagnosis not present

## 2018-07-15 DIAGNOSIS — I4581 Long QT syndrome: Secondary | ICD-10-CM | POA: Diagnosis not present

## 2018-07-15 DIAGNOSIS — R000 Tachycardia, unspecified: Secondary | ICD-10-CM | POA: Diagnosis not present

## 2018-07-15 DIAGNOSIS — R7989 Other specified abnormal findings of blood chemistry: Secondary | ICD-10-CM | POA: Diagnosis not present

## 2018-07-15 DIAGNOSIS — Z20828 Contact with and (suspected) exposure to other viral communicable diseases: Secondary | ICD-10-CM | POA: Diagnosis not present

## 2018-07-15 DIAGNOSIS — N390 Urinary tract infection, site not specified: Secondary | ICD-10-CM | POA: Diagnosis not present

## 2018-07-15 DIAGNOSIS — G309 Alzheimer's disease, unspecified: Secondary | ICD-10-CM | POA: Diagnosis not present

## 2018-07-15 DIAGNOSIS — E876 Hypokalemia: Secondary | ICD-10-CM | POA: Diagnosis not present

## 2018-07-15 DIAGNOSIS — J9601 Acute respiratory failure with hypoxia: Secondary | ICD-10-CM | POA: Diagnosis not present

## 2018-07-15 DIAGNOSIS — F0280 Dementia in other diseases classified elsewhere without behavioral disturbance: Secondary | ICD-10-CM | POA: Diagnosis not present

## 2018-07-15 DIAGNOSIS — I469 Cardiac arrest, cause unspecified: Secondary | ICD-10-CM | POA: Diagnosis not present

## 2018-07-15 DIAGNOSIS — E875 Hyperkalemia: Secondary | ICD-10-CM | POA: Diagnosis not present

## 2018-07-15 DIAGNOSIS — N289 Disorder of kidney and ureter, unspecified: Secondary | ICD-10-CM | POA: Diagnosis not present

## 2018-07-15 DIAGNOSIS — I472 Ventricular tachycardia: Secondary | ICD-10-CM | POA: Diagnosis not present

## 2018-07-15 DIAGNOSIS — A419 Sepsis, unspecified organism: Secondary | ICD-10-CM | POA: Diagnosis not present

## 2018-07-15 DIAGNOSIS — Z452 Encounter for adjustment and management of vascular access device: Secondary | ICD-10-CM | POA: Diagnosis not present

## 2018-07-15 DIAGNOSIS — R601 Generalized edema: Secondary | ICD-10-CM | POA: Diagnosis not present

## 2018-07-15 DIAGNOSIS — E872 Acidosis: Secondary | ICD-10-CM | POA: Diagnosis not present

## 2018-07-15 DIAGNOSIS — Z515 Encounter for palliative care: Secondary | ICD-10-CM | POA: Diagnosis not present

## 2018-07-15 DIAGNOSIS — F419 Anxiety disorder, unspecified: Secondary | ICD-10-CM | POA: Diagnosis not present

## 2018-07-15 DIAGNOSIS — F0390 Unspecified dementia without behavioral disturbance: Secondary | ICD-10-CM | POA: Diagnosis not present

## 2018-07-15 DIAGNOSIS — R740 Nonspecific elevation of levels of transaminase and lactic acid dehydrogenase [LDH]: Secondary | ICD-10-CM | POA: Diagnosis not present

## 2018-07-15 DIAGNOSIS — F320 Major depressive disorder, single episode, mild: Secondary | ICD-10-CM | POA: Diagnosis not present

## 2018-07-15 DIAGNOSIS — E782 Mixed hyperlipidemia: Secondary | ICD-10-CM | POA: Diagnosis not present

## 2018-08-06 DEATH — deceased

## 2019-04-28 IMAGING — CR DG CHEST 2V
2 series · 2 of 2 positions shown · non-contrast
Comparison: March 02, 2016

CLINICAL DATA: Cough for 3 weeks

EXAM:
CHEST  2 VIEW

[w chest pa]
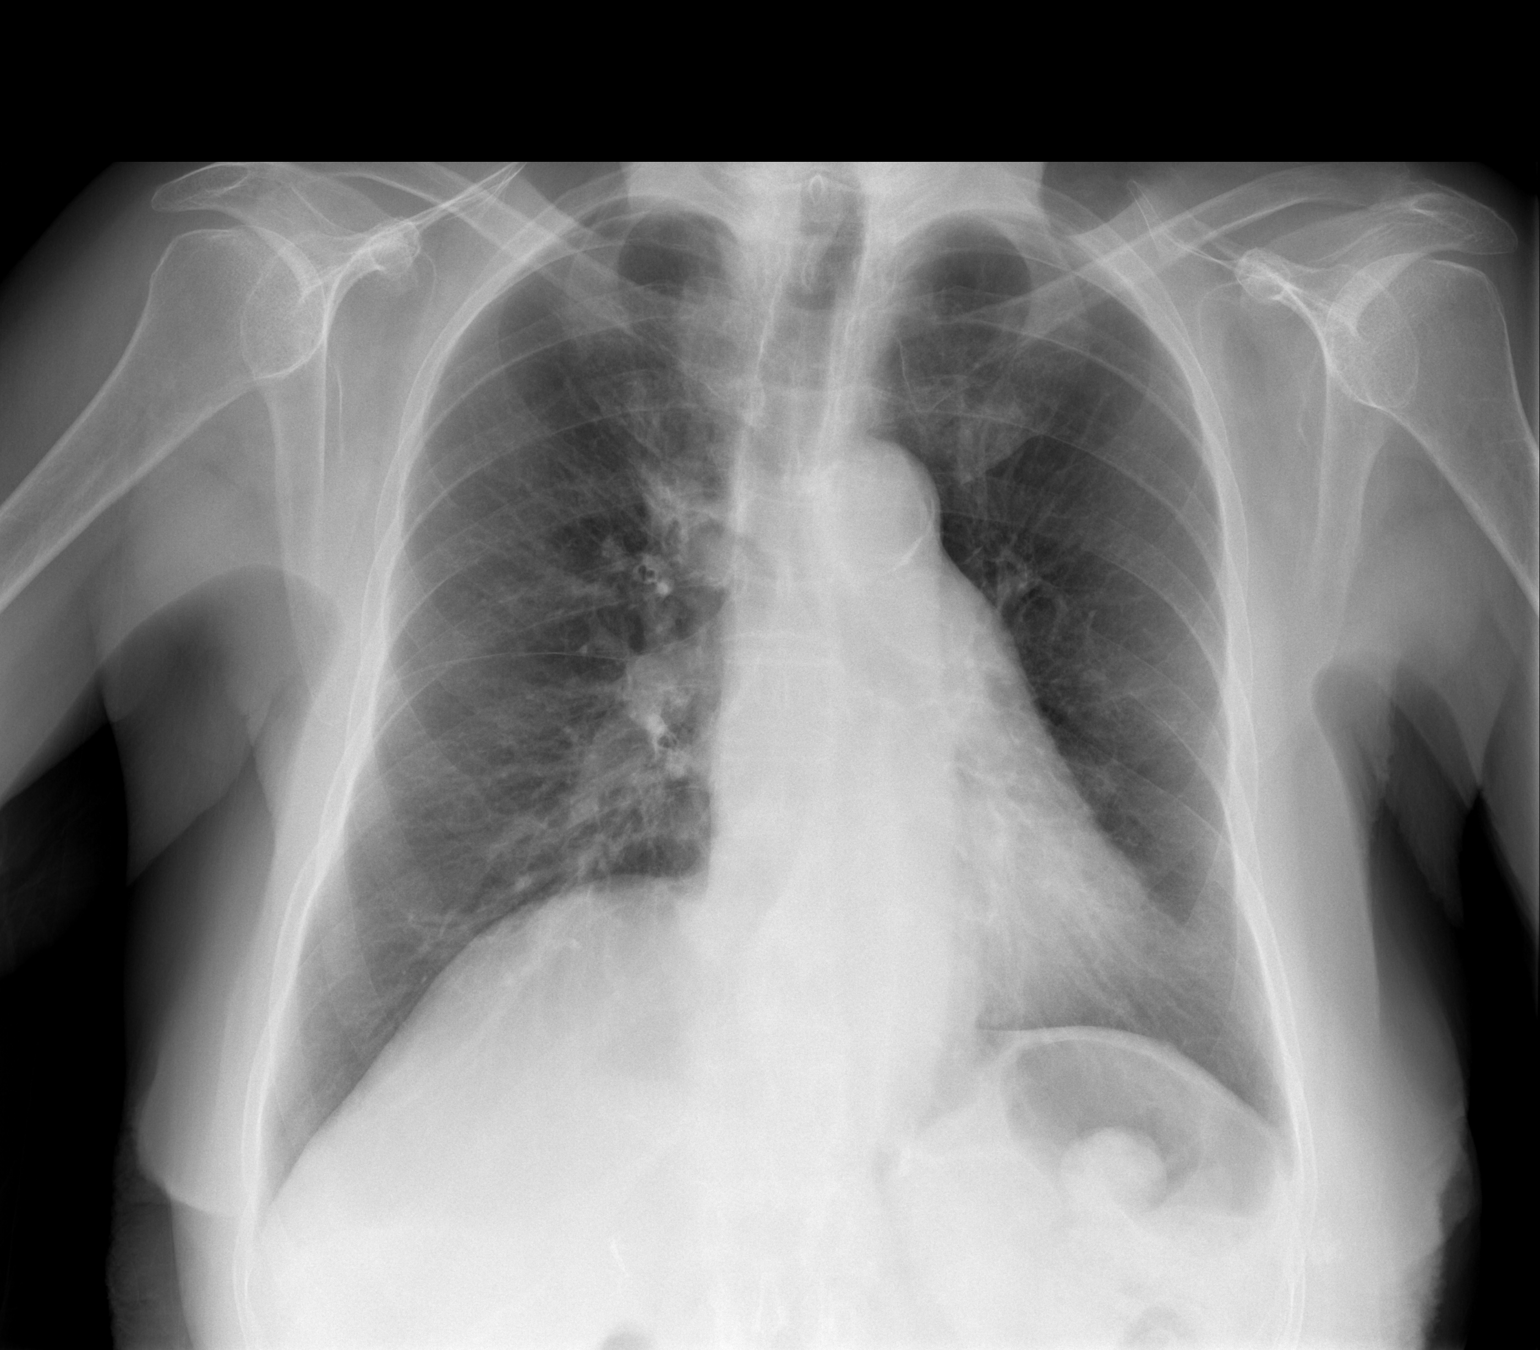

[w chest lat]
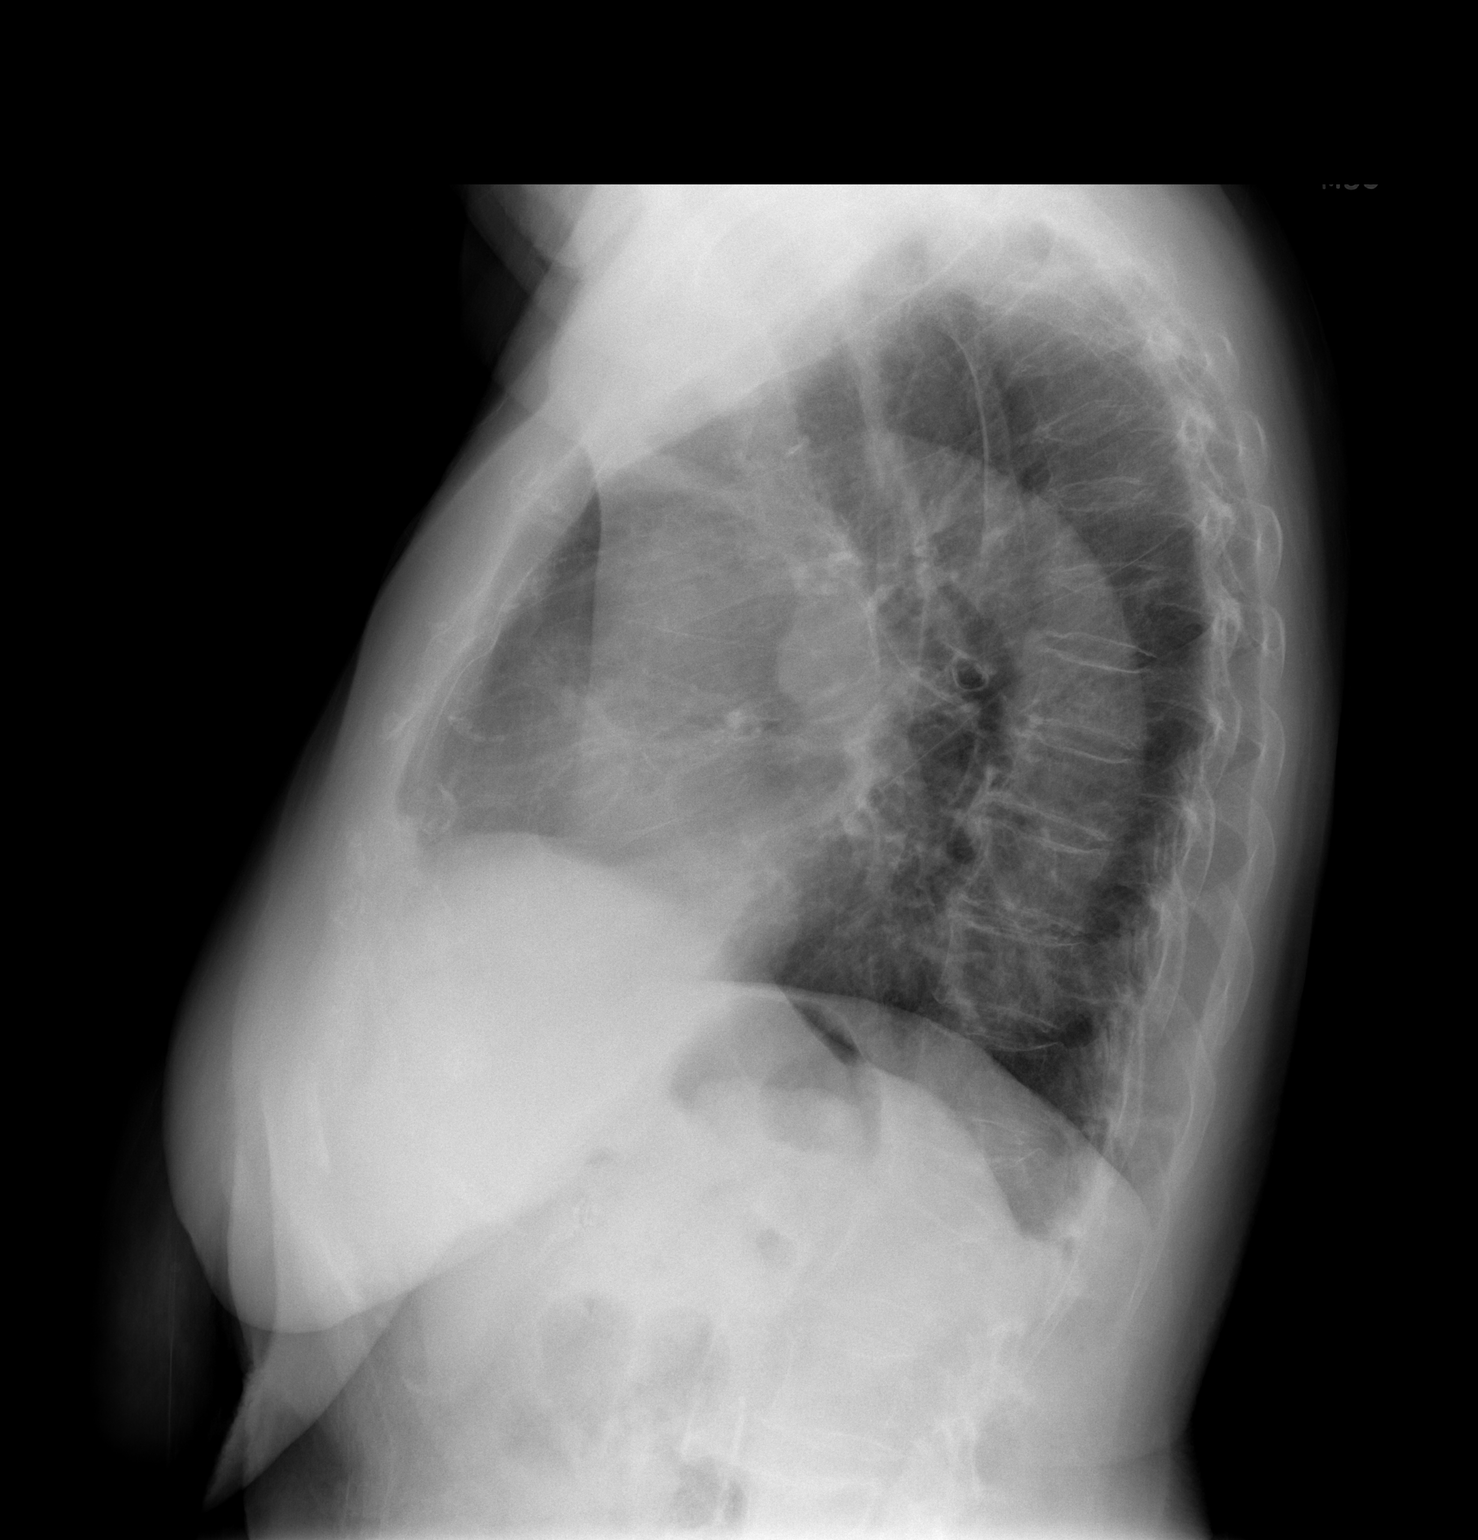

[2 of 2 positions shown; findings below may reference images not displayed]

FINDINGS: The heart size and mediastinal contours are stable. There is no
focal infiltrate, pulmonary edema, or pleural effusion. The
visualized skeletal structures are stable.
IMPRESSION: No active cardiopulmonary disease.
